# Patient Record
Sex: Female | Born: 2000 | Race: White | Hispanic: No | Marital: Single | Smoking: Never smoker
Health system: Southern US, Community
[De-identification: ages and names within clinical notes are randomized; demographics above are authoritative.]

## PROBLEM LIST (undated history)

## (undated) DIAGNOSIS — G43909 Migraine, unspecified, not intractable, without status migrainosus: Secondary | ICD-10-CM

## (undated) DIAGNOSIS — I10 Essential (primary) hypertension: Secondary | ICD-10-CM

## (undated) DIAGNOSIS — F909 Attention-deficit hyperactivity disorder, unspecified type: Secondary | ICD-10-CM

## (undated) DIAGNOSIS — F32A Depression, unspecified: Secondary | ICD-10-CM

## (undated) DIAGNOSIS — F419 Anxiety disorder, unspecified: Secondary | ICD-10-CM

## (undated) HISTORY — DX: Migraine, unspecified, not intractable, without status migrainosus: G43.909

## (undated) HISTORY — DX: Anxiety disorder, unspecified: F41.9

## (undated) HISTORY — DX: Attention-deficit hyperactivity disorder, unspecified type: F90.9

## (undated) HISTORY — PX: OTHER SURGICAL HISTORY: SHX169

## (undated) HISTORY — PX: NO PAST SURGERIES: SHX2092

## (undated) HISTORY — DX: Depression, unspecified: F32.A

## (undated) HISTORY — DX: Essential (primary) hypertension: I10

---
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
## 2022-06-28 ENCOUNTER — Other Ambulatory Visit (INDEPENDENT_AMBULATORY_CARE_PROVIDER_SITE_OTHER): Payer: Medicaid Other

## 2022-06-28 ENCOUNTER — Encounter: Payer: Self-pay | Admitting: Physical Medicine and Rehabilitation

## 2022-06-28 ENCOUNTER — Ambulatory Visit (INDEPENDENT_AMBULATORY_CARE_PROVIDER_SITE_OTHER): Payer: Medicaid Other | Admitting: Physical Medicine and Rehabilitation

## 2022-06-28 DIAGNOSIS — M7918 Myalgia, other site: Secondary | ICD-10-CM

## 2022-06-28 DIAGNOSIS — M5441 Lumbago with sciatica, right side: Secondary | ICD-10-CM

## 2022-06-28 DIAGNOSIS — M5416 Radiculopathy, lumbar region: Secondary | ICD-10-CM

## 2022-06-28 DIAGNOSIS — G8929 Other chronic pain: Secondary | ICD-10-CM | POA: Diagnosis not present

## 2022-06-28 MED ORDER — MELOXICAM 15 MG PO TABS: 15.0000 mg | ORAL_TABLET | Freq: Every day | ORAL | 0 refills | Status: AC

## 2022-06-28 NOTE — Progress Notes (Signed)
Functional Pain Scale - descriptive words and definitions  Distracting (5)    Aware of pain/able to complete some ADL's but limited by pain/sleep is affected and active distractions are only slightly useful. Moderate range order  Average Pain 7  Lower back pain on right side that radiates into the right leg

## 2022-06-28 NOTE — Progress Notes (Unsigned)
Sharon Lester - 22 y.o. female MRN [id number]  Date of birth: 2000-06-20  Office Visit Note: Visit Date: 06/28/2022 PCP: Kaleen Mask, MD Referred by: Kaleen Mask, *  Subjective: Chief Complaint  Patient presents with   Lower Back - Pain   HPI: Sharon Lester is a 22 y.o. female who comes in today as a self referral for evaluation of chronic, worsening and severe right sided lower back pain radiating down entire right leg down to foot. Pain ongoing for about 9 months.  Pain started during pregnancy, worsened after epidural during labor. Reports pre-eclampsia and 30 hour labor with prolonged pushing. She recently completed long car drive to [place] which seemed to exacerbate her symptoms. Her pain is more intermittent, no aggravating factors. Some days her pain is severe, causing difficulty walking. She describes pain as sharp and shooting sensation, currently rates as 2 out of 10. Some relief of pain with home exercise regimen, rest and use of medications. Some relief of oral Prednisone prescribed by PCP. No prior imaging of lumbar spine. 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 Patient denies focal weakness, numbness and tingling. No recent trauma or falls.     Oswestry Disability Index Score 16% 0 to 10 (20%)   minimal disability: The patient can cope with most living activities. Usually no treatment is indicated apart from advice on lifting sitting and exercise.  Review of Systems  Musculoskeletal:  Positive for back pain.  Neurological:  Negative for tingling, sensory change, focal weakness and weakness.  All other systems reviewed and are negative.  Otherwise per HPI.  Assessment & Plan: Visit Diagnoses:    ICD-10-CM   1. Chronic right-sided low back pain with right-sided sciatica  M54.41 XR Lumbar Spine 2-3 Views   G89.29 Ambulatory referral to Physical Therapy    2. Lumbar radiculopathy  M54.16 XR Lumbar Spine 2-3 Views    Ambulatory  referral to Physical Therapy    3. Myofascial pain syndrome  M79.18        Plan: Findings:  Chronic, worsening and severe intermittent right sided lower back pain radiating down right leg to foot. Patient continues to have severe pain despite good conservative therapies such as home exercise regimen, rest and use of medications. I obtained 2 view lumbar x-rays in the office today exhibit normal anatomical alignment, no spondylolisthesis. Patients clinical presentation and exam are complex, differentials could include lumbar radiculopathy vs myofascial pain syndrome. Right lumbar paraspinal region is tender upon palpation today. Can't rule out sacroiliac joint dysfunction, there are mild sclerotic changes to right sacroiliac joint. Next step is to place order for formal physical therapy. Would recommend core strengthening with focus on right sacroiliac joint. If her pain persists we would consider performing diagnostic right sacroiliac joint injection. If we feel this is more lumbar spine related would also look at obtaining lumbar MRI imaging. I would like to see patient back in 6 weeks for re-evaluation. No red flag symptoms noted upon exam today.     Meds & Orders:  Meds ordered this encounter  Medications   meloxicam (MOBIC) 15 MG tablet    Sig: Take 1 tablet (15 mg total) by mouth daily.    Dispense:  30 tablet    Refill:  0    Orders Placed This Encounter  Procedures   XR Lumbar Spine 2-3 Views   Ambulatory referral to Physical Therapy    Follow-up: Return for 6 week follow up for re-evaluation.  Procedures: No procedures performed      Clinical History: No specialty comments available.   She reports that she has never smoked. She has never used smokeless tobacco. No results for input(s): "HGBA1C", "LABURIC" in the last 8760 hours.  Objective:  VS:  HT:    WT:   BMI:     BP:   HR: bpm  TEMP: ( )  RESP:  Physical Exam Vitals and nursing note reviewed.  HENT:     Head:  Normocephalic and atraumatic.     Right Ear: External ear normal.     Left Ear: External ear normal.     Nose: Nose normal.     Mouth/Throat:     Mouth: Mucous membranes are moist.  Eyes:     Extraocular Movements: Extraocular movements intact.  Cardiovascular:     Rate and Rhythm: Normal rate.     Pulses: Normal pulses.  Pulmonary:     Effort: Pulmonary effort is normal.  Abdominal:     General: Abdomen is flat. There is no distension.  Musculoskeletal:        General: Tenderness present.     Cervical back: Normal range of motion.     Comments: Patient rises from seated position to standing without difficulty. Good lumbar range of motion. No pain noted with facet loading. 5/5 strength noted with bilateral hip flexion, knee flexion/extension, ankle dorsiflexion/plantarflexion and EHL. No clonus noted bilaterally. No pain upon palpation of greater trochanters. No pain with internal/external rotation of bilateral hips. Negative FABER, Gaenslen's and SI joint compression testing. Sensation intact bilaterally. Negative slump test bilaterally. Tenderness noted to right lumbar paraspinal region. Ambulates without aid, gait steady.     Skin:    General: Skin is warm and dry.     Capillary Refill: Capillary refill takes less than 2 seconds.  Neurological:     General: No focal deficit present.     Mental Status: She is alert and oriented to person, place, and time.  Psychiatric:        Mood and Affect: Mood normal.        Behavior: Behavior normal.     Ortho Exam  Imaging: XR Lumbar Spine 2-3 Views  Result Date: 06/28/2022 AP and lateral radiographs of lumbar spine exhibit normal anatomical alignment, no spondylolisthesis. Well preserved disc spacing. Sclerosis noted to right sacroiliac joint. Good joint spacing noted to hips bilaterally. No acute fractures or dislocations.   Past Medical/Family/Surgical/Social History: Medications & Allergies reviewed per EMR, new medications  updated. Patient Active Problem List   Diagnosis Date Noted   Chronic hypertension 10/12/2021   Depression 07/08/2021   Facial fracture (HCC) 08/29/2018   Past Medical History:  Diagnosis Date   ADHD    Anxiety    Depression    Hypertension    Migraine    Family History  Problem Relation Age of Onset   Hypertension Father    Mental illness Father    Asthma Brother    Cancer Maternal Grandmother    Anemia Maternal Grandmother    Past Surgical History:  Procedure Laterality Date   anxiety     NO PAST SURGERIES     Social History   Occupational History   Not on file  Tobacco Use   Smoking status: Never   Smokeless tobacco: Never  Vaping Use   Vaping Use: Former   Substances: Nicotine, Flavoring  Substance and Sexual Activity   Alcohol use: Yes   Drug use: Not Currently  Types: Marijuana    Comment: last used months ago   Sexual activity: Not Currently    Birth control/protection: None

## 2022-08-09 ENCOUNTER — Ambulatory Visit: Payer: Medicaid Other | Admitting: Physical Medicine and Rehabilitation

## 2022-10-01 ENCOUNTER — Telehealth: Payer: Self-pay | Admitting: *Deleted

## 2022-10-01 NOTE — Telephone Encounter (Signed)
Left patient a message to call and schedule annual.
